# Patient Record
Sex: Female | Born: 2000 | Race: White | Hispanic: No | Marital: Single | Smoking: Never smoker
Health system: Southern US, Community
[De-identification: ages and names within clinical notes are randomized; demographics above are authoritative.]

---
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
## 2019-05-26 IMAGING — CT CT HEAD W/O CM
3 series · 16 of 47 positions shown, 19 images · non-contrast
Comparison: None.

CLINICAL DATA: Ped, headache, no neuro deficit or signs of incr
ICP. Headache for 4 days.

EXAM:
CT HEAD WITHOUT CONTRAST
TECHNIQUE: Contiguous axial images were obtained from the base of the skull
through the vertex without intravenous contrast.

[Series 2: head wo · axial · 0.47mm/px · z∈[-36,+88]mm · 10 of 30 slices shown, 13 images]
[im 3/30  brain]
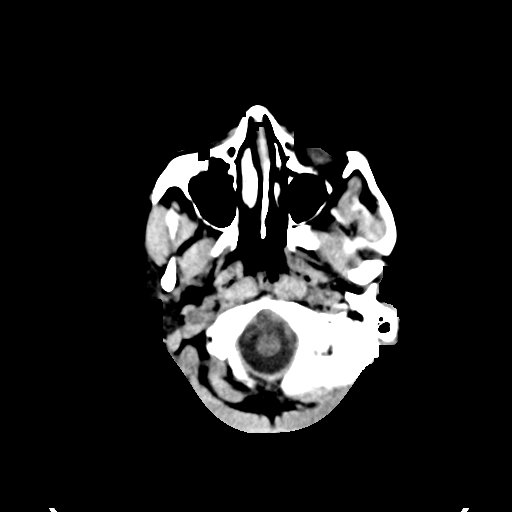
[im 3/30  bone]
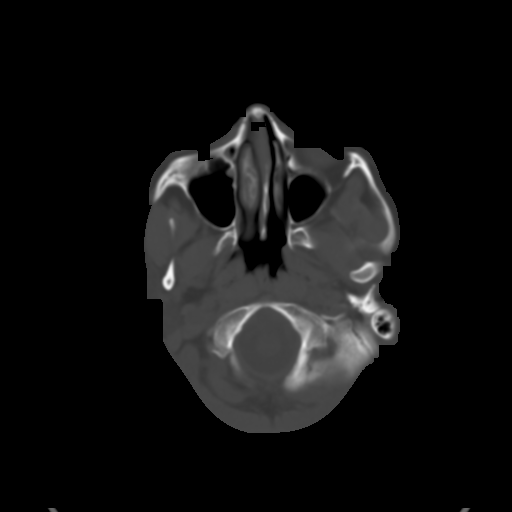
[im 6/30  brain]
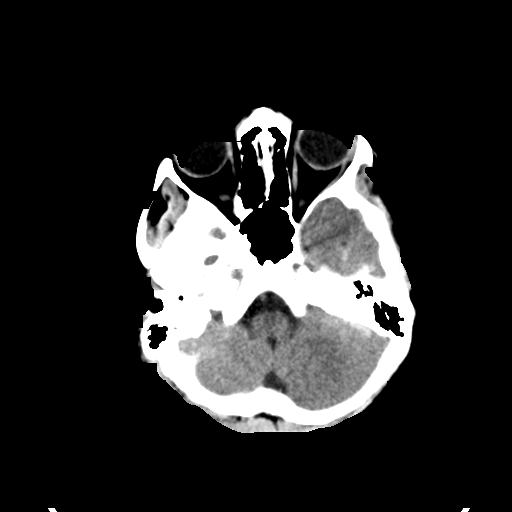
[im 9/30  brain]
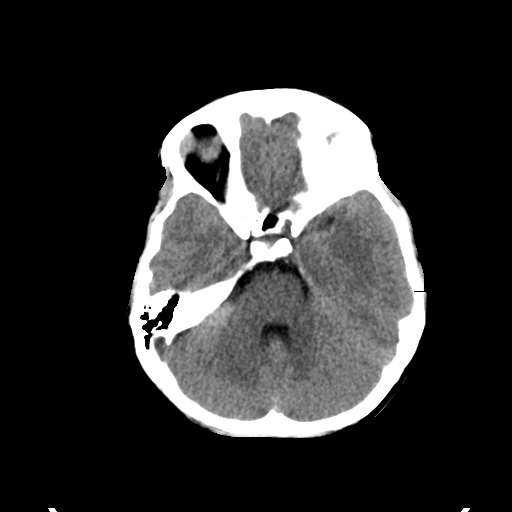
[im 11/30  brain]
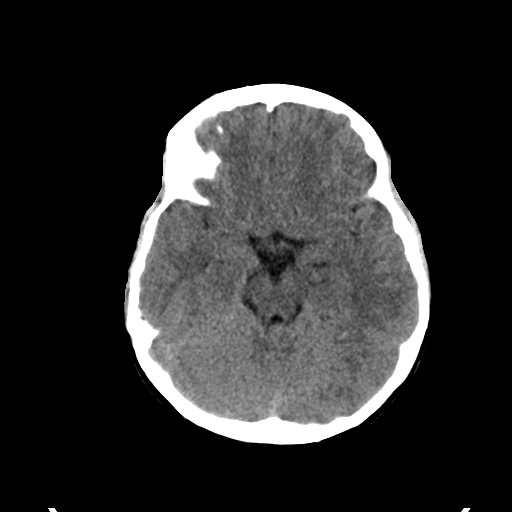
[im 14/30  brain]
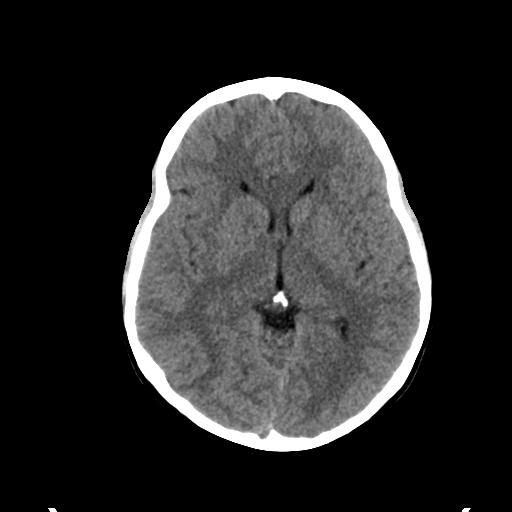
[im 14/30  bone]
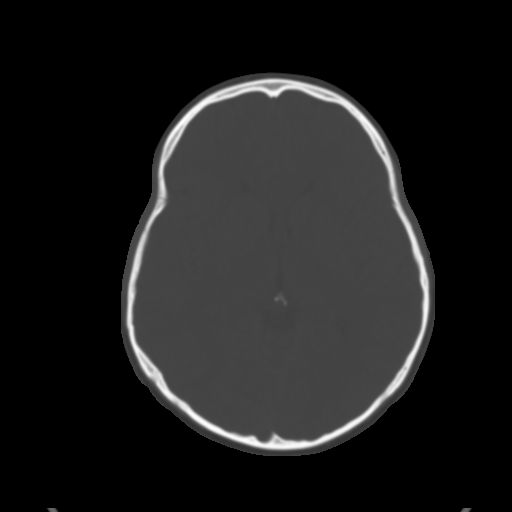
[im 17/30  brain]
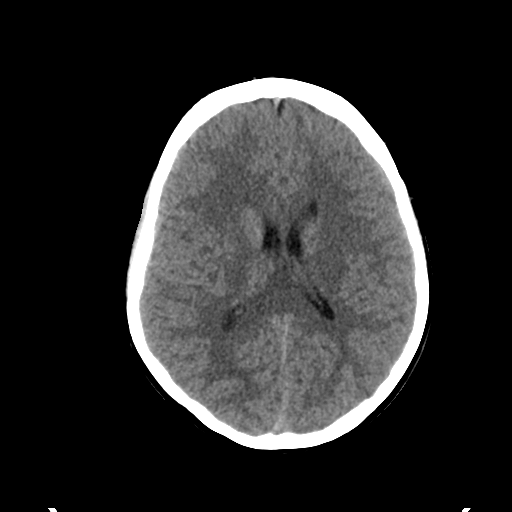
[im 20/30  brain]
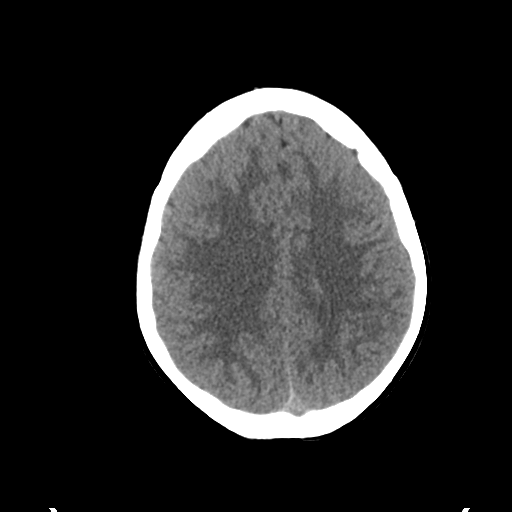
[im 23/30  brain]
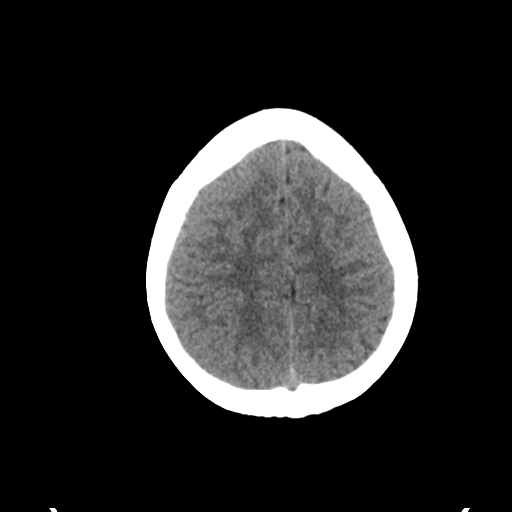
[im 25/30  brain]
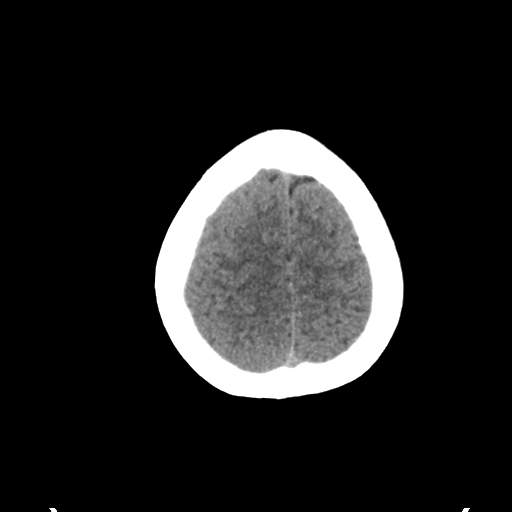
[im 25/30  bone]
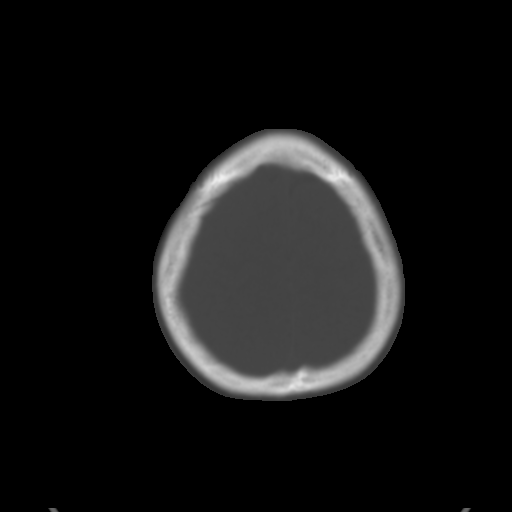
[im 28/30  brain]
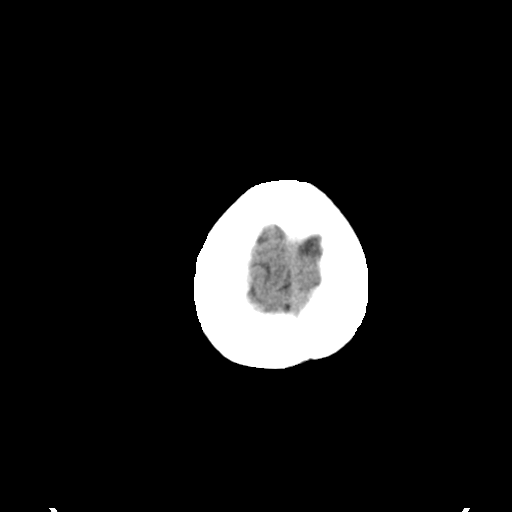

[Series 4: coronal soft tissue · coronal · 0.27mm/px · 3 of 59 slices shown]
[im 20/59  brain]
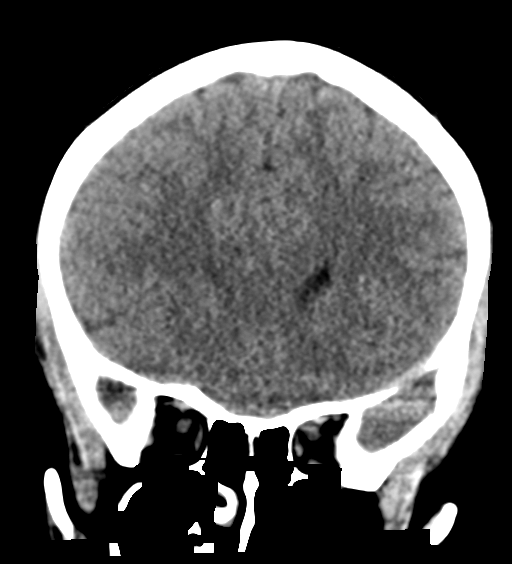
[im 26/59  brain]
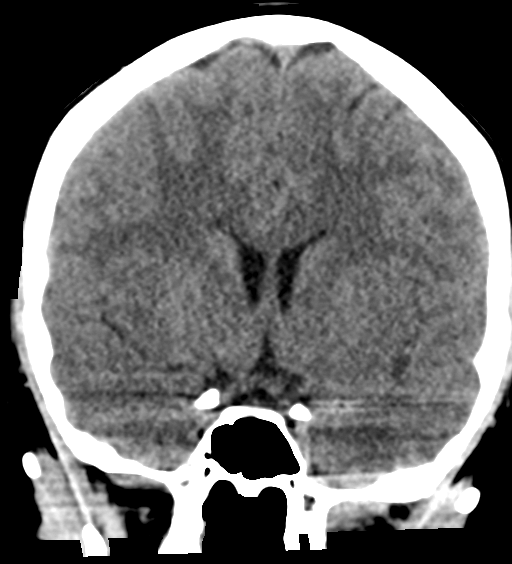
[im 33/59  brain]
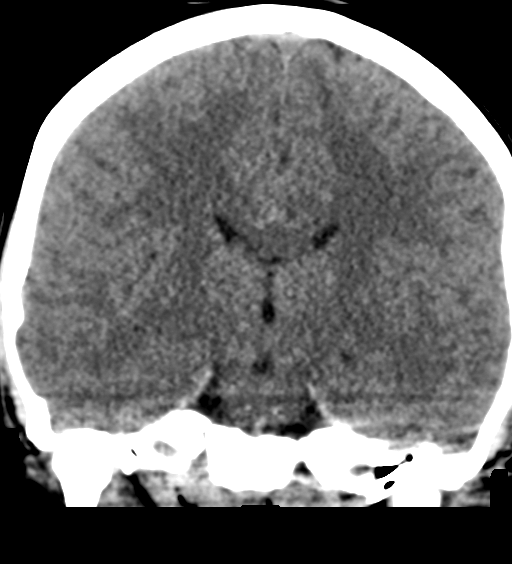

[Series 5: sagittal soft tissue · sagittal · 0.31mm/px · 3 of 49 slices shown]
[im 17/49  brain]
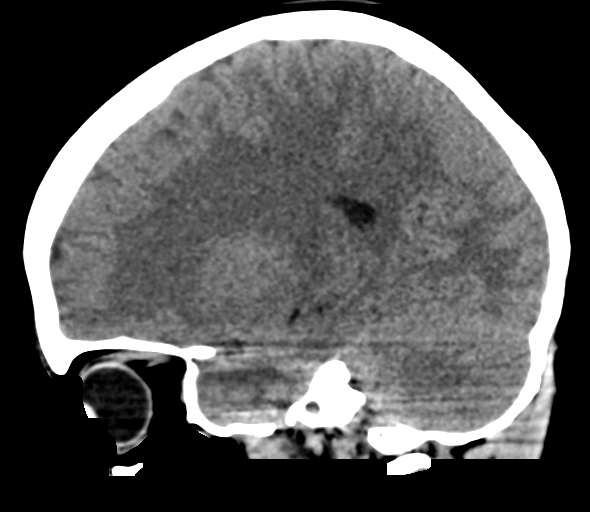
[im 25/49  brain]
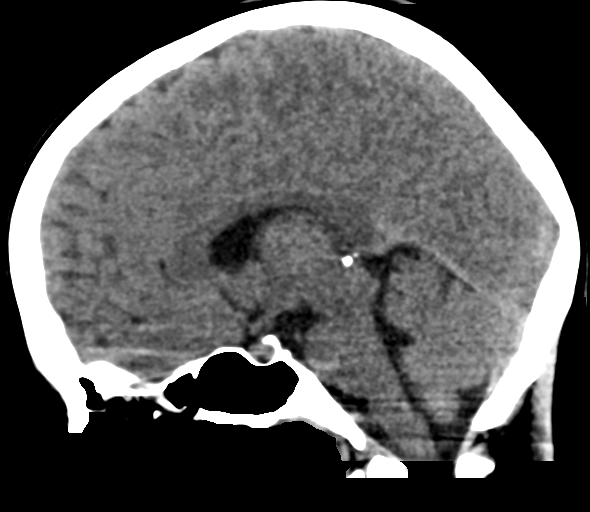
[im 33/49  brain]
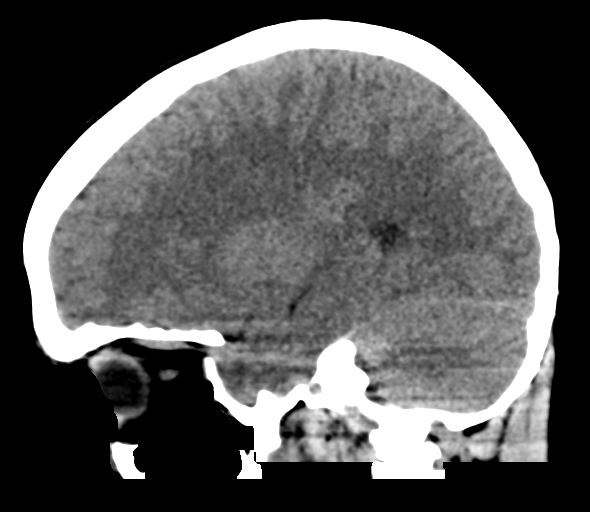

[16 of 47 positions shown; findings below may reference images not displayed]

FINDINGS: Brain: No intracranial hemorrhage, mass effect, or midline shift. No
hydrocephalus. The basilar cisterns are patent. No evidence of
territorial infarct or acute ischemia. No extra-axial or
intracranial fluid collection.

Vascular: No hyperdense vessel or unexpected calcification.

Skull: No fracture or focal lesion.

Sinuses/Orbits: No acute findings. No sinus inflammation. Frontal
sinuses are hypo pneumatized, congenital.

Other: None.
IMPRESSION: Negative head CT.

## 2020-07-07 ENCOUNTER — Emergency Department (HOSPITAL_COMMUNITY): Payer: Self-pay

## 2020-07-07 ENCOUNTER — Encounter (HOSPITAL_COMMUNITY): Payer: Self-pay | Admitting: Emergency Medicine

## 2020-07-07 ENCOUNTER — Other Ambulatory Visit: Payer: Self-pay

## 2020-07-07 ENCOUNTER — Emergency Department (HOSPITAL_COMMUNITY)
Admission: EM | Admit: 2020-07-07 | Discharge: 2020-07-07 | Disposition: A | Discharge status: Home / Self Care | Payer: Self-pay | Attending: Emergency Medicine | Admitting: Emergency Medicine

## 2020-07-07 DIAGNOSIS — R1031 Right lower quadrant pain: Secondary | ICD-10-CM | POA: Insufficient documentation

## 2020-07-07 DIAGNOSIS — R112 Nausea with vomiting, unspecified: Secondary | ICD-10-CM | POA: Insufficient documentation

## 2020-07-07 DIAGNOSIS — Z7722 Contact with and (suspected) exposure to environmental tobacco smoke (acute) (chronic): Secondary | ICD-10-CM | POA: Insufficient documentation

## 2020-07-07 LAB — CBC WITH DIFFERENTIAL/PLATELET
Abs Immature Granulocytes: 0.03 10*3/uL (ref 0.00–0.07)
Basophils Absolute: 0.1 10*3/uL (ref 0.0–0.1)
Basophils Relative: 1 %
Eosinophils Absolute: 0.1 10*3/uL (ref 0.0–0.5)
Eosinophils Relative: 3 %
HCT: 38.9 % (ref 36.0–46.0)
Hemoglobin: 12.9 g/dL (ref 12.0–15.0)
Immature Granulocytes: 1 %
Lymphocytes Relative: 36 %
Lymphs Abs: 1.6 10*3/uL (ref 0.7–4.0)
MCH: 30.4 pg (ref 26.0–34.0)
MCHC: 33.2 g/dL (ref 30.0–36.0)
MCV: 91.7 fL (ref 80.0–100.0)
Monocytes Absolute: 0.5 10*3/uL (ref 0.1–1.0)
Monocytes Relative: 11 %
Neutro Abs: 2.1 10*3/uL (ref 1.7–7.7)
Neutrophils Relative %: 48 %
Platelets: 242 10*3/uL (ref 150–400)
RBC: 4.24 MIL/uL (ref 3.87–5.11)
RDW: 12 % (ref 11.5–15.5)
WBC: 4.3 10*3/uL (ref 4.0–10.5)
nRBC: 0 % (ref 0.0–0.2)

## 2020-07-07 LAB — COMPREHENSIVE METABOLIC PANEL
ALT: 13 U/L (ref 0–44)
AST: 17 U/L (ref 15–41)
Albumin: 4.1 g/dL (ref 3.5–5.0)
Alkaline Phosphatase: 57 U/L (ref 38–126)
Anion gap: 9 (ref 5–15)
BUN: 13 mg/dL (ref 6–20)
CO2: 25 mmol/L (ref 22–32)
Calcium: 8.9 mg/dL (ref 8.9–10.3)
Chloride: 105 mmol/L (ref 98–111)
Creatinine, Ser: 0.8 mg/dL (ref 0.44–1.00)
GFR, Estimated: >60 mL/min (ref >60)
Glucose, Bld: 105 mg/dL — ABNORMAL HIGH (ref 70–99)
Potassium: 3.6 mmol/L (ref 3.5–5.1)
Sodium: 139 mmol/L (ref 135–145)
Total Bilirubin: 0.6 mg/dL (ref 0.3–1.2)
Total Protein: 6.9 g/dL (ref 6.5–8.1)

## 2020-07-07 LAB — URINALYSIS, ROUTINE W REFLEX MICROSCOPIC
Bilirubin Urine: NEGATIVE
Glucose, UA: NEGATIVE mg/dL
Hgb urine dipstick: NEGATIVE
Ketones, ur: NEGATIVE mg/dL
Nitrite: NEGATIVE
Protein, ur: NEGATIVE mg/dL
Specific Gravity, Urine: 1.019 (ref 1.005–1.030)
pH: 6 (ref 5.0–8.0)

## 2020-07-07 LAB — PREGNANCY, URINE: Preg Test, Ur: NEGATIVE

## 2020-07-07 MED ORDER — IOHEXOL 300 MG/ML  SOLN
100.0000 mL | Freq: Once | INTRAMUSCULAR | Status: AC | PRN
  Administered 2020-07-07: 75 mL via INTRAVENOUS

## 2020-07-07 MED ORDER — SODIUM CHLORIDE 0.9 % IV BOLUS
500.0000 mL | Freq: Once | INTRAVENOUS | Status: AC
  Administered 2020-07-07: 500 mL via INTRAVENOUS

## 2020-07-07 MED ORDER — ALUM & MAG HYDROXIDE-SIMETH 400-400-40 MG/5ML PO SUSP: 10.0000 mL | Freq: Four times a day (QID) | ORAL | 0 refills | Status: DC | PRN

## 2020-07-07 MED ORDER — PANTOPRAZOLE SODIUM 20 MG PO TBEC: 20.0000 mg | DELAYED_RELEASE_TABLET | Freq: Every day | ORAL | 0 refills | Status: DC

## 2020-07-07 MED ORDER — ONDANSETRON 4 MG PO TBDP: 4.0000 mg | ORAL_TABLET | Freq: Three times a day (TID) | ORAL | 0 refills | Status: DC | PRN

## 2020-07-07 NOTE — ED Triage Notes (Signed)
Pt states she has been having bloody stools for " a couple of months" and that "right before she came here tonight; she started vomiting blood".

## 2020-07-07 NOTE — Discharge Instructions (Addendum)
You were seen in the emergency room today with abdominal discomfort, vomiting, and concern for possible blood in the bowel movements.  Your CT scan and lab work here is reassuring.  I have listed the name of a primary care doctor as well as a gastroenterologist.  Please call both offices today to schedule the next available follow-up appointments.  Return to the emergency department if developing new or suddenly worsening symptoms such as severe pain, fevers, or other sudden/severe symptoms.

## 2020-07-07 NOTE — ED Provider Notes (Signed)
Emergency Department Provider Note   I have reviewed the triage vital signs and the nursing notes.   HISTORY  Chief Complaint Rectal Bleeding   HPI Paula [name] is a 19 y.o. female with past medical history reviewed below presents to the emergency department with blood in her bowel movements intermittently over the past month.  She states that last night she vomited a small amount of bright red blood that ultimately prompted her ED evaluation.  She states that she will see what appears to be clots mixed with her bowel movements which are bright red.  She sometimes sees bright red blood mixed with stool.  She denies any black, tarry, sticky bowel movements.  She is having some lower abdominal discomfort worse in the right side.  She had a single episode of vomiting last night with no chest pain or shortness of breath.  No fevers.  No similar symptoms in the past. No dysuria, hesitancy, or urgency. No vaginal bleeding.    History reviewed. No pertinent past medical history.  Patient Active Problem List   Diagnosis Date Noted  . MDD (major depressive disorder), single episode, severe (HCC) 12/15/2015  . Single current episode of major depressive disorder   . MDD (major depressive disorder) 12/13/2015    History reviewed. No pertinent surgical history.  Allergies Patient has no known allergies.  History reviewed. No pertinent family history.  Social History Social History   Tobacco Use  . Smoking status: Passive Smoke Exposure - Never Smoker  . Smokeless tobacco: Never Used  Vaping Use  . Vaping Use: Never used  Substance Use Topics  . Alcohol use: No  . Drug use: No    Review of Systems  Constitutional: No fever/chills Eyes: No visual changes. ENT: No sore throat. Cardiovascular: Denies chest pain. Respiratory: Denies shortness of breath. Gastrointestinal: Positive abdominal pain. Positive nausea and vomiting.  No diarrhea.  No constipation. Positive  BRBPR. Genitourinary: Negative for dysuria. Musculoskeletal: Negative for back pain. Skin: Negative for rash. Neurological: Negative for headaches, focal weakness or numbness.  10-point ROS otherwise negative.  ____________________________________________   PHYSICAL EXAM:  VITAL SIGNS: ED Triage Vitals  Enc Vitals Group     BP 07/07/20 0508 127/71     Pulse Rate 07/07/20 0508 (!) 105     Resp 07/07/20 0508 16     Temp 07/07/20 0508 98.3 F (36.8 C)     Temp Source 07/07/20 0508 Oral     SpO2 07/07/20 0508 96 %     Weight 07/07/20 0509 125 lb (56.7 kg)     Height 07/07/20 0509 5\' 5"  (1.651 m)   Constitutional: Alert and oriented. Well appearing and in no acute distress. Eyes: Conjunctivae are normal. Head: Atraumatic. Nose: No congestion/rhinnorhea. Mouth/Throat: Mucous membranes are moist.   Neck: No stridor.   Cardiovascular: Tachycardia. Good peripheral circulation. Grossly normal heart sounds.   Respiratory: Normal respiratory effort.  No retractions. Lungs CTAB. Gastrointestinal: Soft with mild right sided tenderness. No rebound or guarding. No distention.  Musculoskeletal: No gross deformities of extremities. Neurologic:  Normal speech and language.  Skin:  Skin is warm, dry and intact. No rash noted.   ____________________________________________   LABS (all labs ordered are listed, but only abnormal results are displayed)  Labs Reviewed  URINE CULTURE - Abnormal; Notable for the following components:      Result Value   Culture MULTIPLE SPECIES PRESENT, SUGGEST RECOLLECTION (*)    All other components within normal limits  COMPREHENSIVE  METABOLIC PANEL - Abnormal; Notable for the following components:   Glucose, Bld 105 (*)    All other components within normal limits  URINALYSIS, ROUTINE W REFLEX MICROSCOPIC - Abnormal; Notable for the following components:   Leukocytes,Ua TRACE (*)    Bacteria, UA RARE (*)    All other components within normal limits   CBC WITH DIFFERENTIAL/PLATELET  PREGNANCY, URINE   ____________________________________________  RADIOLOGY  CT imaging reviewed with no acute findings.  ____________________________________________   PROCEDURES  Procedure(s) performed:   Procedures  None  ____________________________________________   INITIAL IMPRESSION / ASSESSMENT AND PLAN / ED COURSE  Pertinent labs & imaging results that were available during my care of the patient were reviewed by me and considered in my medical decision making (see chart for details).   Patient presents emergency department for evaluation of blood in the bowel movements intermittently over the past couple of months but now with some blood mixed with her emesis last night.  Lab work is reassuring with no severe anemia.  She is hemodynamically stable here.  Plan for CT imaging of the abdomen pelvis to rule out colitis/other inflammatory change.   Labs reviewed.  No clear source of bleeding.  No anemia.  CT of the abdomen and pelvis reviewed with no acute findings to explain patient's symptoms.  Plan for GI follow-up with information provided at discharge.  Patient to call the clinic today to schedule the first available appointment.  Also provided contact information for PCP. Discussed ED return precautions.  ____________________________________________  FINAL CLINICAL IMPRESSION(S) / ED DIAGNOSES  Final diagnoses:  Right lower quadrant abdominal pain  Non-intractable vomiting with nausea, unspecified vomiting type     MEDICATIONS GIVEN DURING THIS VISIT:  Medications  sodium chloride 0.9 % bolus 500 mL (0 mLs Intravenous Stopped 07/07/20 1048)  iohexol (OMNIPAQUE) 300 MG/ML solution 100 mL (75 mLs Intravenous Contrast Given 07/07/20 0943)     NEW OUTPATIENT MEDICATIONS STARTED DURING THIS VISIT:  Discharge Medication List as of 07/07/2020 10:54 AM    START taking these medications   Details  alum & mag hydroxide-simeth (MAALOX  MAX) 400-400-40 MG/5ML suspension Take 10 mLs by mouth every 6 (six) hours as needed for indigestion., Starting Mon 07/07/2020, Normal    ondansetron (ZOFRAN ODT) 4 MG disintegrating tablet Take 1 tablet (4 mg total) by mouth every 8 (eight) hours as needed., Starting Mon 07/07/2020, Normal    pantoprazole (PROTONIX) 20 MG tablet Take 1 tablet (20 mg total) by mouth daily., Starting Mon 07/07/2020, Normal        Note:  This document was prepared using Dragon voice recognition software and may include unintentional dictation errors.  Alona Bene, MD, [facility]    Leiloni Smithers, Arlyss Repress, MD 07/08/20 [phone]

## 2020-07-08 LAB — URINE CULTURE

## 2022-10-13 ENCOUNTER — Ambulatory Visit
Admission: EM | Admit: 2022-10-13 | Discharge: 2022-10-13 | Disposition: A | Discharge status: Home / Self Care | Payer: No Typology Code available for payment source

## 2022-10-13 DIAGNOSIS — H1031 Unspecified acute conjunctivitis, right eye: Secondary | ICD-10-CM | POA: Diagnosis not present

## 2022-10-13 MED ORDER — ERYTHROMYCIN 5 MG/GM OP OINT: TOPICAL_OINTMENT | Freq: Four times a day (QID) | OPHTHALMIC | 0 refills | Status: AC

## 2022-10-13 NOTE — ED Provider Notes (Signed)
[facility]    CSN: [id number] Arrival date & time: 10/13/22  1003      History   Chief Complaint Chief Complaint  Patient presents with   Eye Problem    HPI Paula [name] is a 22 y.o. female.   Patient presents today with 1 day history of right eye redness, watery drainage, burning.  She denies foreign body sensation, visual changes, swelling around the eye, photophobia, headache, or recent upper respiratory infection symptoms.  Does not use contact lenses.  No recent eye trauma.  Reports the eye is itchy and when she woke up this morning, her eyelids were very crusty and matted.  No recent contacts with similar symptoms.  Has not tried anything for symptoms so far.    History reviewed. No pertinent past medical history.  Patient Active Problem List   Diagnosis Date Noted   MDD (major depressive disorder), single episode, severe ([place]) 12/15/2015   Single current episode of major depressive disorder    MDD (major depressive disorder) 12/13/2015    History reviewed. No pertinent surgical history.  OB History   No obstetric history on file.      Home Medications    Prior to Admission medications   Medication Sig Start Date End Date Taking? Authorizing Provider  erythromycin ophthalmic ointment Place into the right eye 4 (four) times daily for 7 days. Place a 1/2 inch ribbon of ointment into the lower eyelid. 10/13/22 10/20/22 Yes Eulogio Bear, NP  medroxyPROGESTERone (DEPO-PROVERA) 150 MG/ML injection Inject 150 mg into the muscle every 3 (three) months.   Yes [provider]    Family History History reviewed. No pertinent family history.  Social History Social History   Tobacco Use   Smoking status: Never    Passive exposure: Yes   Smokeless tobacco: Never  Vaping Use   Vaping Use: Never used  Substance Use Topics   Alcohol use: Never   Drug use: Never     Allergies   Patient has no known allergies.   Review of  Systems Review of Systems Per HPI  Physical Exam Triage Vital Signs ED Triage Vitals  Enc Vitals Group     BP 10/13/22 1132 115/77     Pulse Rate 10/13/22 1132 94     Resp 10/13/22 1132 16     Temp 10/13/22 1132 98.7 F (37.1 C)     Temp Source 10/13/22 1132 Oral     SpO2 10/13/22 1132 99 %     Weight --      Height --      Head Circumference --      Peak Flow --      Pain Score 10/13/22 1133 10     Pain Loc --      Pain Edu? --      Excl. in [place]? --    No data found.  Updated Vital Signs BP 115/77 (BP Location: Right Arm)   Pulse 94   Temp 98.7 F (37.1 C) (Oral)   Resp 16   SpO2 99%   Visual Acuity Right Eye Distance:   Left Eye Distance:   Bilateral Distance:    Right Eye Near:   Left Eye Near:    Bilateral Near:     Physical Exam Vitals and nursing note reviewed.  Constitutional:      General: She is not in acute distress.    Appearance: Normal appearance. She is not toxic-appearing.  HENT:     Head:  Normocephalic and atraumatic.     Nose: Nose normal. No congestion or rhinorrhea.     Mouth/Throat:     Mouth: Mucous membranes are moist.     Pharynx: Oropharynx is clear. No posterior oropharyngeal erythema.  Eyes:     General: No scleral icterus.       Right eye: Discharge present. No hordeolum.        Left eye: No discharge.     Extraocular Movements: Extraocular movements intact.     Right eye: Normal extraocular motion.     Left eye: Normal extraocular motion.     Conjunctiva/sclera:     Right eye: Right conjunctiva is injected. No exudate.    Pupils: Pupils are equal, round, and reactive to light.  Neurological:     Mental Status: She is alert.      UC Treatments / Results  Labs (all labs ordered are listed, but only abnormal results are displayed) Labs Reviewed - No data to display  EKG   Radiology No results found.  Procedures Procedures (including critical care time)  Medications Ordered in UC Medications - No data to  display  Initial Impression / Assessment and Plan / UC Course  I have reviewed the triage vital signs and the nursing notes.  Pertinent labs & imaging results that were available during my care of the patient were reviewed by me and considered in my medical decision making (see chart for details).   Patient is well-appearing, normotensive, afebrile, not tachycardic, not tachypneic, oxygenating well on room air.    1. Acute bacterial conjunctivitis of right eye Suspect bacterial conjunctivitis Treat with erythromycin ointment 4 times a day for 7 days Discussed throwing away all old eye make-up ER and return precautions discussed Note given for work  The patient was given the opportunity to ask questions.  All questions answered to their satisfaction.  The patient is in agreement to this plan.    Final Clinical Impressions(s) / UC Diagnoses   Final diagnoses:  Acute bacterial conjunctivitis of right eye     Discharge Instructions      As we discussed, it sounds like you have pinkeye.  Please use the erythromycin ointment as prescribed to treat the bacteria.  This will also help with inflammation in the eye.  You can use warm compresses to help with crusting or matting of your eyelids.  In general, try to avoid touching your eye and make sure you are washing hands frequently after you touch it.    ED Prescriptions     Medication Sig Dispense Auth. Provider   erythromycin ophthalmic ointment Place into the right eye 4 (four) times daily for 7 days. Place a 1/2 inch ribbon of ointment into the lower eyelid. 3.5 g Eulogio Bear, NP      PDMP not reviewed this encounter.   Eulogio Bear, NP 10/13/22 [phone]

## 2022-10-13 NOTE — ED Triage Notes (Signed)
Pt reports redness and discomfort in right eye x 1 day.

## 2022-10-13 NOTE — Discharge Instructions (Signed)
As we discussed, it sounds like you have pinkeye.  Please use the erythromycin ointment as prescribed to treat the bacteria.  This will also help with inflammation in the eye.  You can use warm compresses to help with crusting or matting of your eyelids.  In general, try to avoid touching your eye and make sure you are washing hands frequently after you touch it.
